# Patient Record
Sex: Male | Born: 1970 | Race: White | Hispanic: Yes | Smoking: Never smoker
Health system: Southern US, Community
[De-identification: ages and names within clinical notes are randomized; demographics above are authoritative.]

## PROBLEM LIST (undated history)

## (undated) DIAGNOSIS — R569 Unspecified convulsions: Secondary | ICD-10-CM

## (undated) HISTORY — DX: Unspecified convulsions: R56.9

## (undated) HISTORY — PX: OTHER SURGICAL HISTORY: SHX169

---

## 2001-10-08 ENCOUNTER — Emergency Department (HOSPITAL_COMMUNITY): Admission: EM | Admit: 2001-10-08 | Discharge: 2001-10-09 | Payer: Self-pay | Admitting: *Deleted

## 2002-08-09 ENCOUNTER — Encounter: Payer: Self-pay | Admitting: Emergency Medicine

## 2002-08-09 ENCOUNTER — Emergency Department (HOSPITAL_COMMUNITY): Admission: EM | Admit: 2002-08-09 | Discharge: 2002-08-09 | Payer: Self-pay | Admitting: Emergency Medicine

## 2003-12-05 IMAGING — CT CT HEAD W/O CM
1 series · 16 of 28 positions shown, 20 images · non-contrast
Comparison: none

FINDINGS
CLINICAL DATA: HEADACHE FOR THE PAST THREE DAYS, HISTORY OF SEIZURES.
CT HEAD WITHOUT CONTRAST MEDIA
AXIAL IMAGES OF THE HEAD, OBTAINED WITHOUT INTRAVENOUS CONTRAST, ARE CORRELATED WITH THE PREVIOUS
HEAD CT DATED 09/29/98 AND PREVIOUS BRAIN MRI EXAMINATION DATED 10/09/98.  AGAIN DEMONSTRATED ARE
MULTIPLE CYSTIC AND CALCIFIED LESIONS IN BOTH CEREBRAL HEMISPHERES.  THERE HAS BEEN INTERVAL
ENLARGEMENT OF CYSTIC LESIONS IN BOTH PARIETAL LOBES.  THESE INCLUDE A 3.2 CM CYSTIC AREA WITH
SURROUNDING WHITE MATTER EDEMA IN THE LEFT PARIETAL LOBE AND 2.9 CM AND 2.7 CM SIMILAR AREAS IN THE
RIGHT PARIETAL LOBE.  THE WHITE MATTER EDEMA WAS NOT PREVIOUSLY PRESENT.  THE VENTRICLES REMAIN
NORMAL IN SIZE AND POSITION.  NO INTRACRANIAL HEMORRHAGE IS SEEN.  UNREMARKABLE BONES AND PARANASAL
SINUSES.
IMPRESSION
PROGRESSIVE NEUROCYSTISERCOSIS WITH AREAS OF ACTIVE INFECTION IN BOTH PARIETAL LOBES AS DESCRIBED
ABOVE.

[Series 7035: — · axial · 0.39mm/px · z∈[-615,-490]mm · 16 of 28 slices shown, 20 images]
[im 2/28  brain]
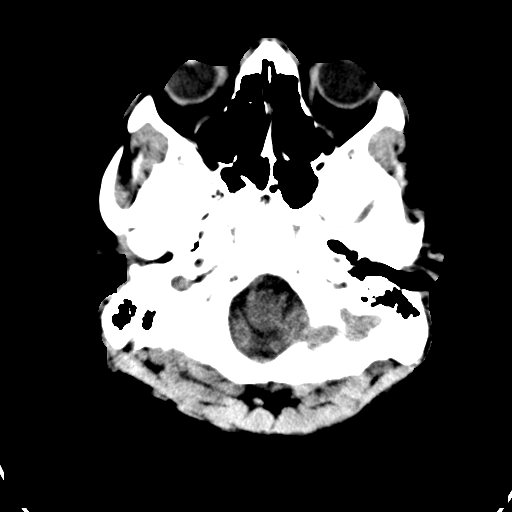
[im 2/28  bone]
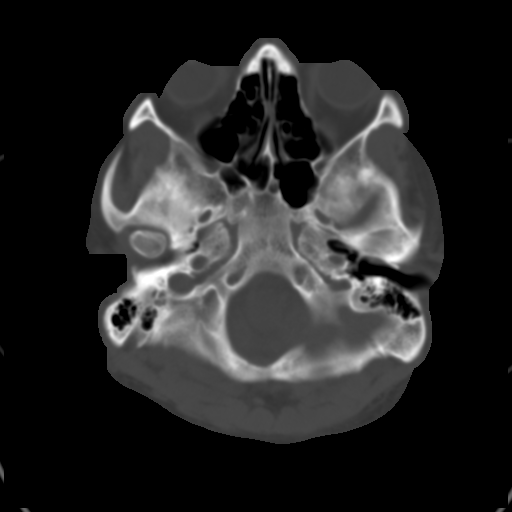
[im 4/28  brain]
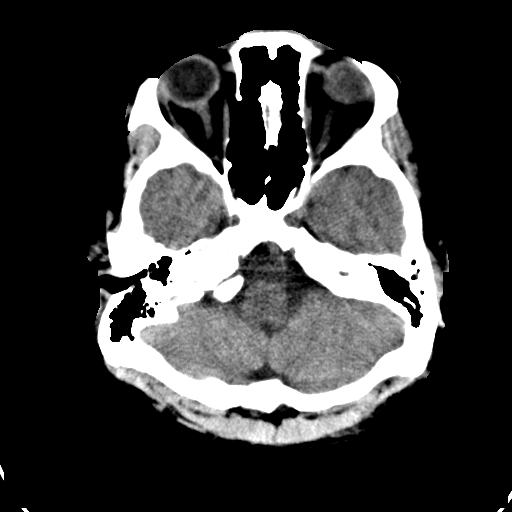
[im 6/28  brain]
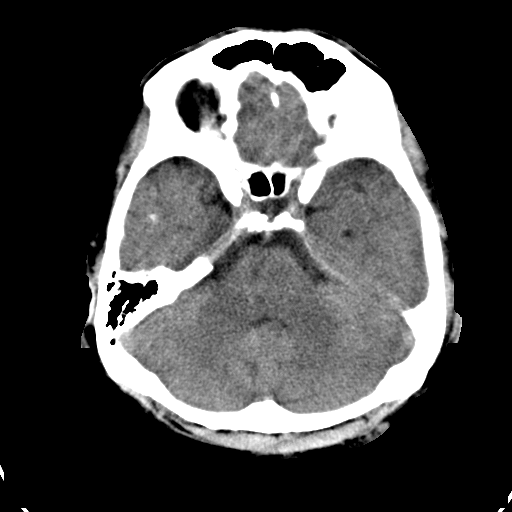
[im 7/28  brain]
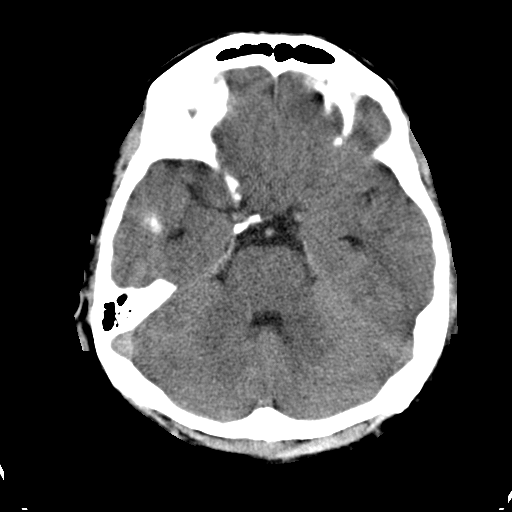
[im 9/28  brain]
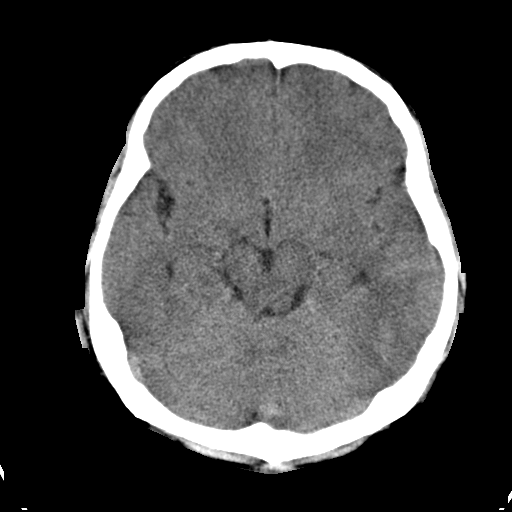
[im 9/28  bone]
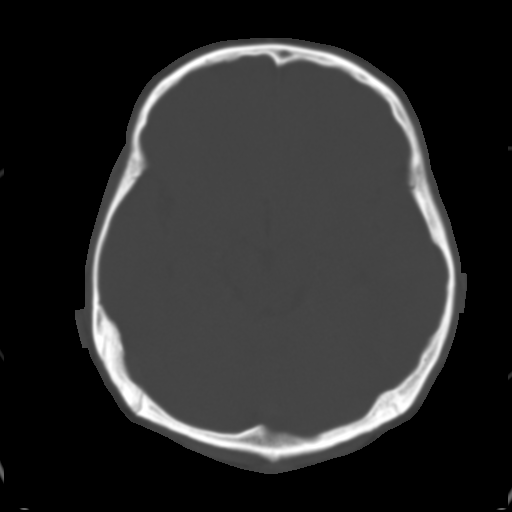
[im 10/28  brain]
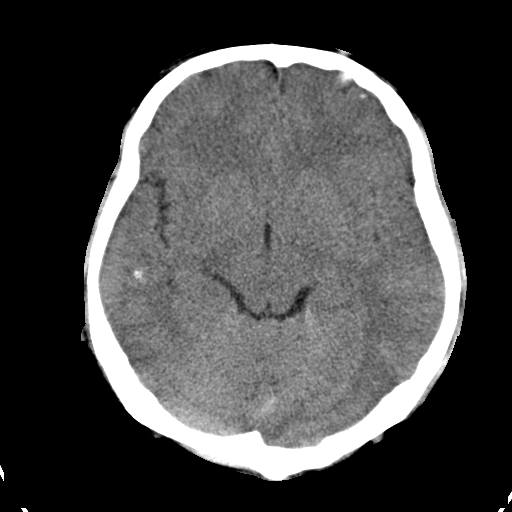
[im 12/28  brain]
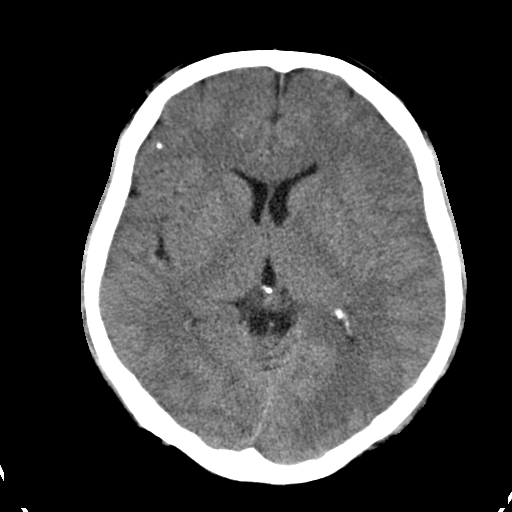
[im 14/28  brain]
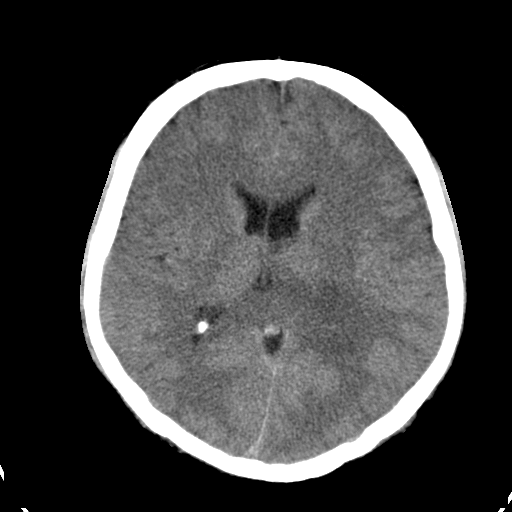
[im 15/28  brain]
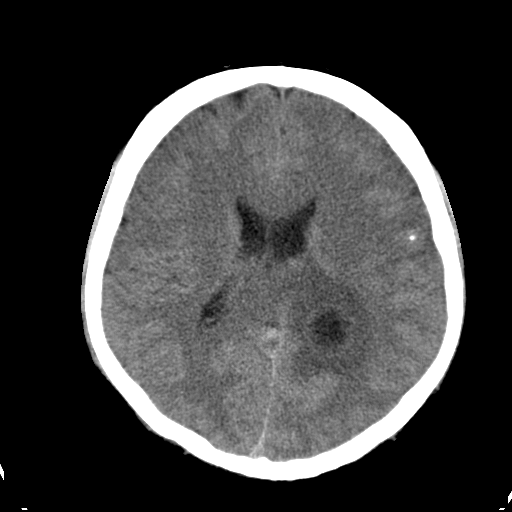
[im 15/28  bone]
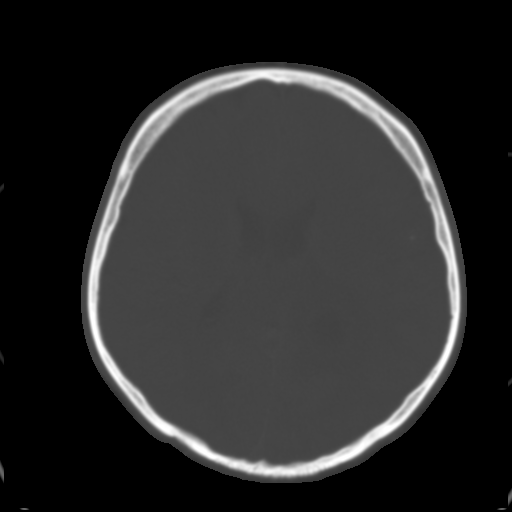
[im 17/28  brain]
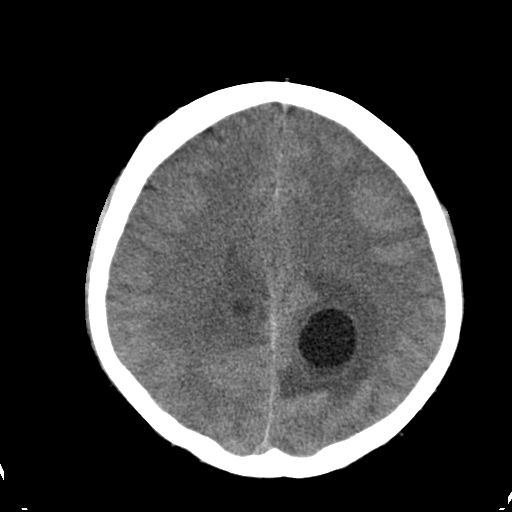
[im 19/28  brain]
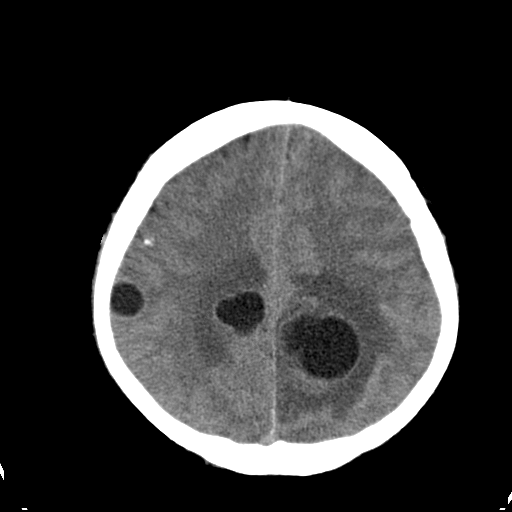
[im 20/28  brain]
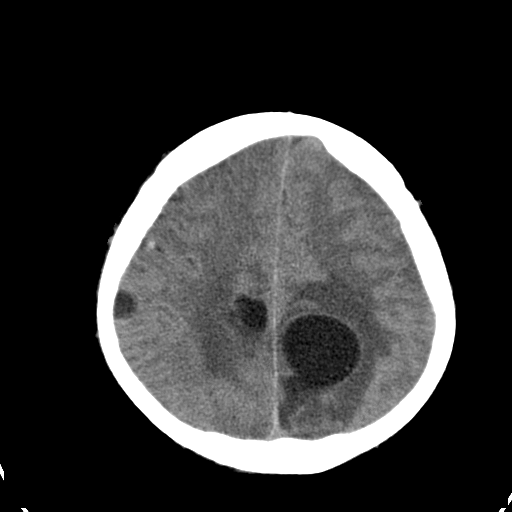
[im 22/28  brain]
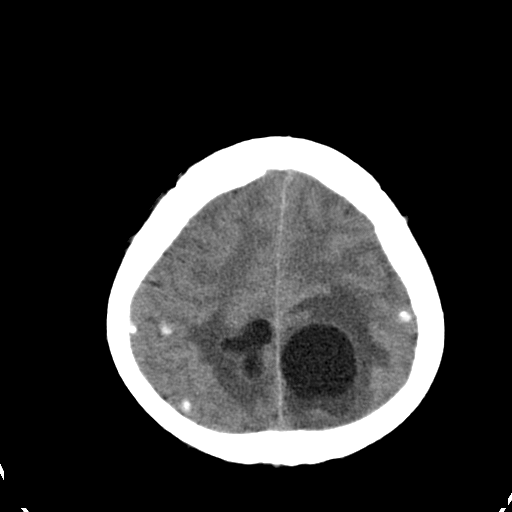
[im 22/28  bone]
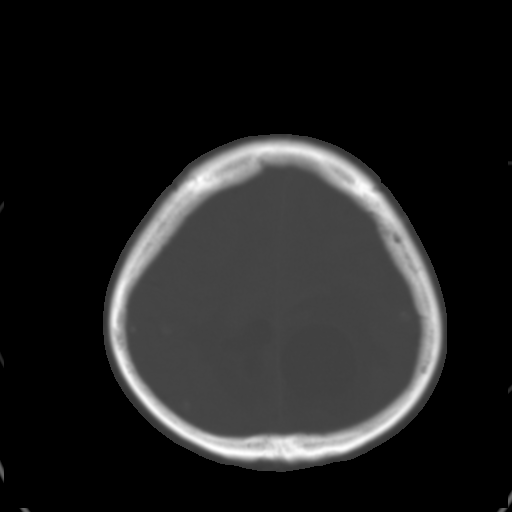
[im 23/28  brain]
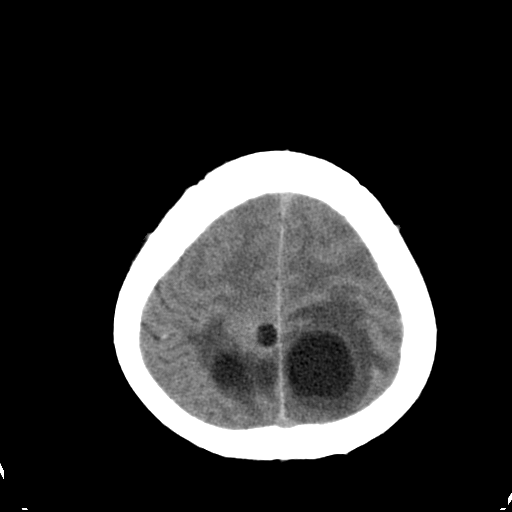
[im 25/28  brain]
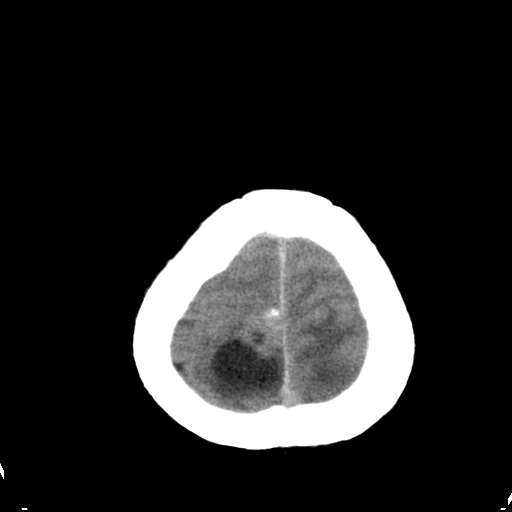
[im 27/28  brain]
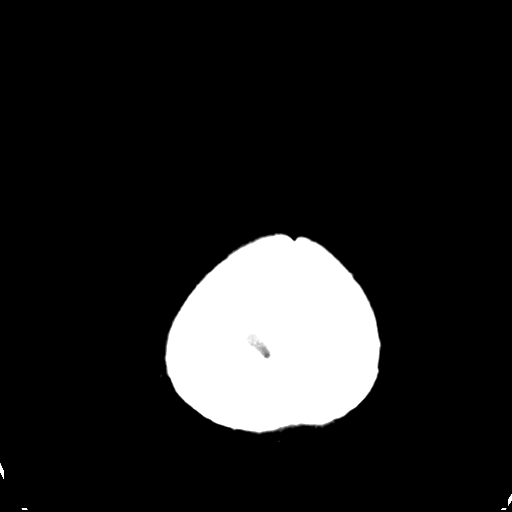

[16 of 28 positions shown; findings below may reference images not displayed]

## 2004-04-22 ENCOUNTER — Emergency Department (HOSPITAL_COMMUNITY): Admission: EM | Admit: 2004-04-22 | Discharge: 2004-04-22 | Payer: Self-pay | Admitting: Emergency Medicine

## 2004-07-10 ENCOUNTER — Emergency Department (HOSPITAL_COMMUNITY): Admission: EM | Admit: 2004-07-10 | Discharge: 2004-07-10 | Payer: Self-pay | Admitting: Emergency Medicine

## 2004-11-05 ENCOUNTER — Emergency Department (HOSPITAL_COMMUNITY): Admission: EM | Admit: 2004-11-05 | Discharge: 2004-11-05 | Payer: Self-pay | Admitting: *Deleted

## 2005-07-13 ENCOUNTER — Emergency Department (HOSPITAL_COMMUNITY): Admission: EM | Admit: 2005-07-13 | Discharge: 2005-07-14 | Payer: Self-pay | Admitting: Emergency Medicine

## 2006-03-03 IMAGING — CT CT HEAD W/O CM
1 series · 16 of 30 positions shown, 20 images · IV contrast (agent unspecified)
Comparison: 07/10/2004.

CLINICAL DATA: Seizures.  Known history of neurosarcoidosis.  
HEAD CT WITHOUT CONTRAST:

[Series 7836: — · axial · 0.40mm/px · z∈[-671,-536]mm · 16 of 30 slices shown, 20 images]
[im 2/30  brain]
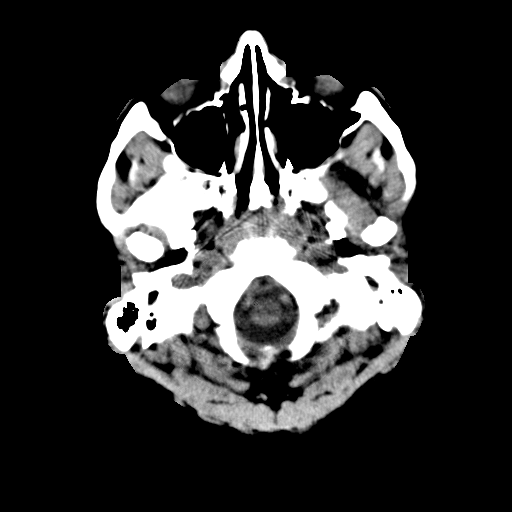
[im 2/30  bone]
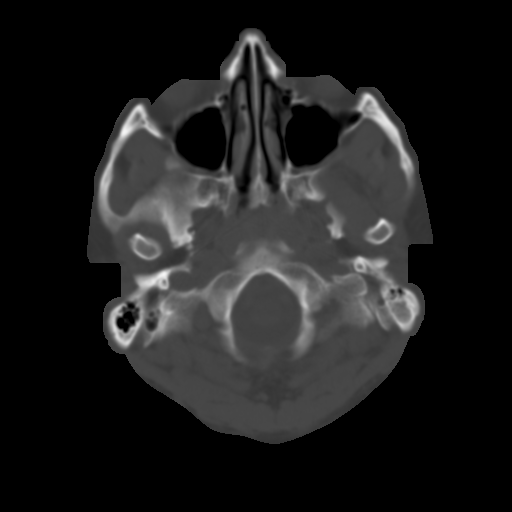
[im 4/30  brain]
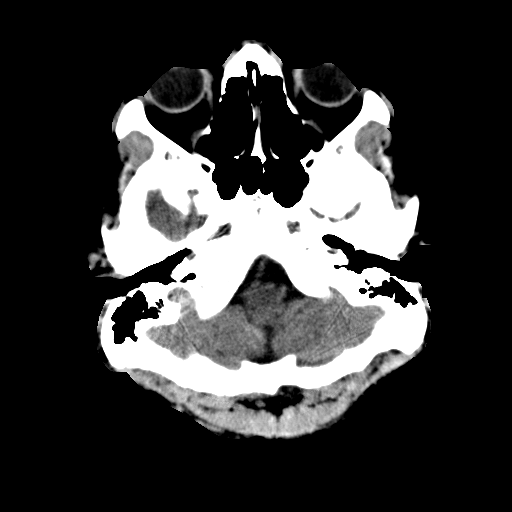
[im 6/30  brain]
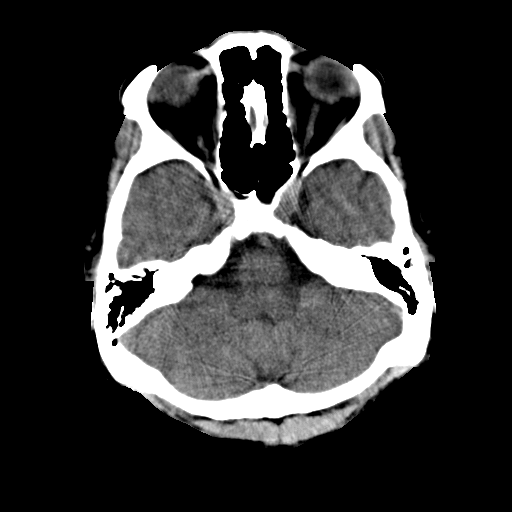
[im 8/30  brain]
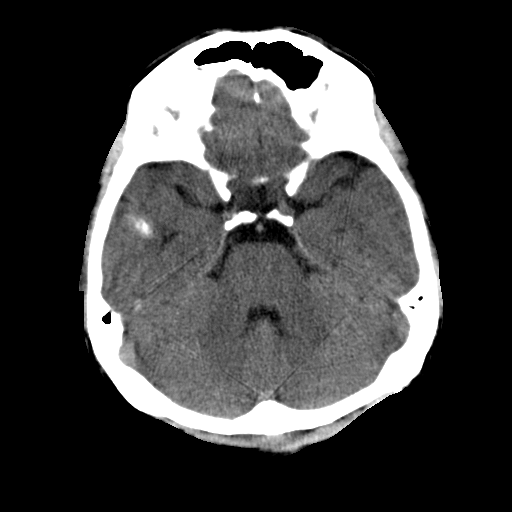
[im 9/30  brain]
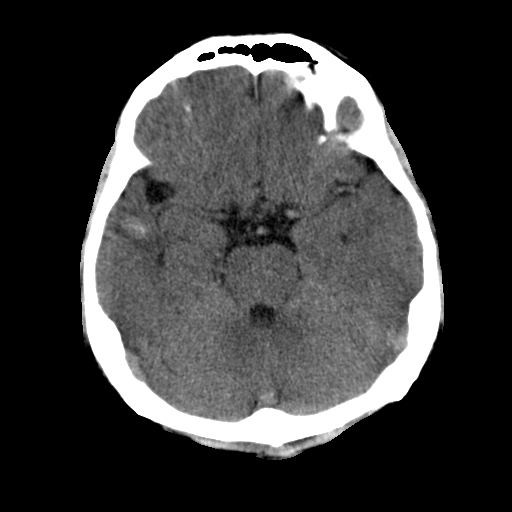
[im 9/30  bone]
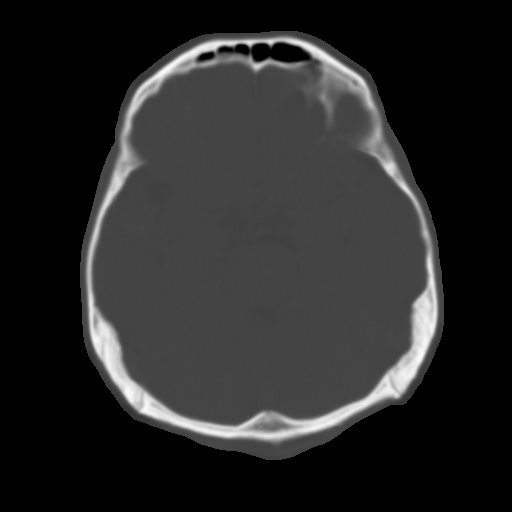
[im 11/30  brain]
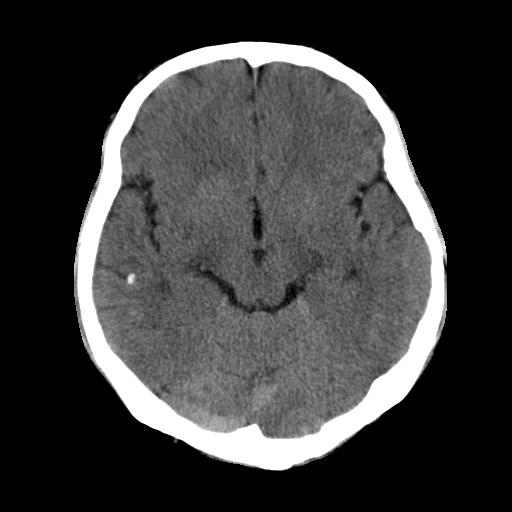
[im 13/30  brain]
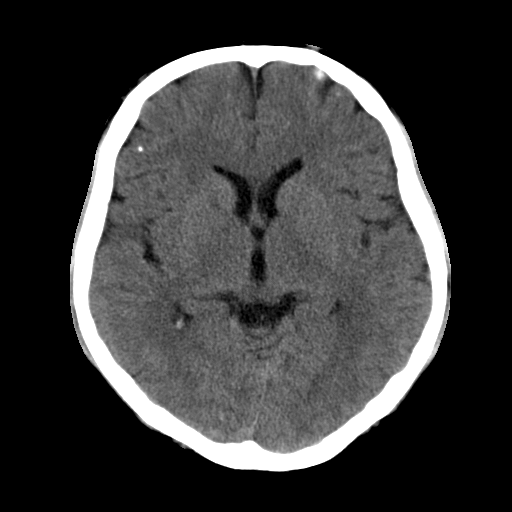
[im 15/30  brain]
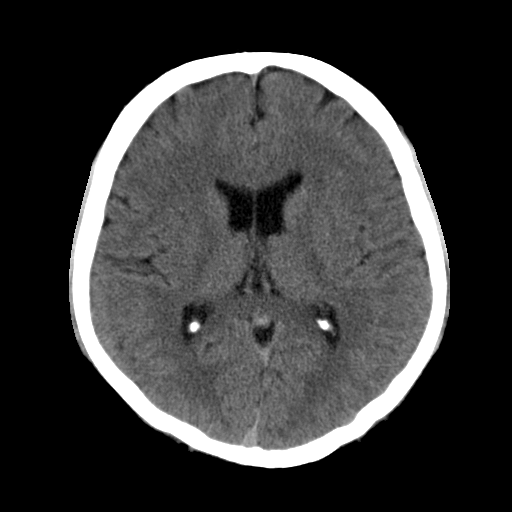
[im 16/30  brain]
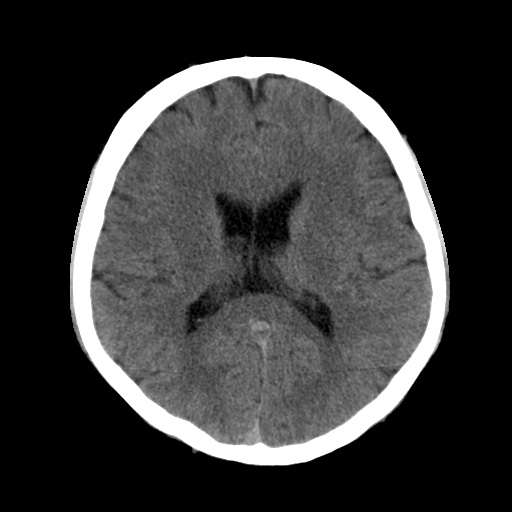
[im 16/30  bone]
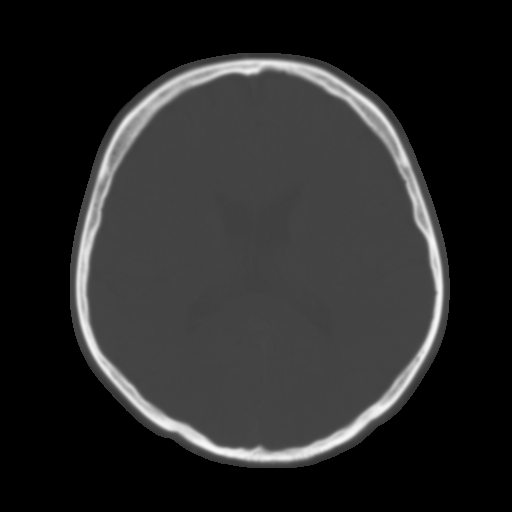
[im 18/30  brain]
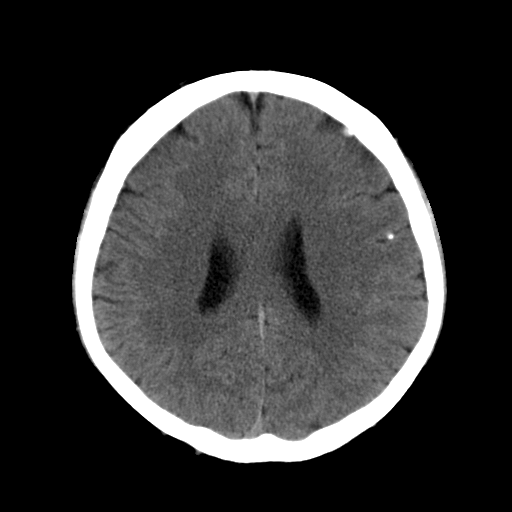
[im 20/30  brain]
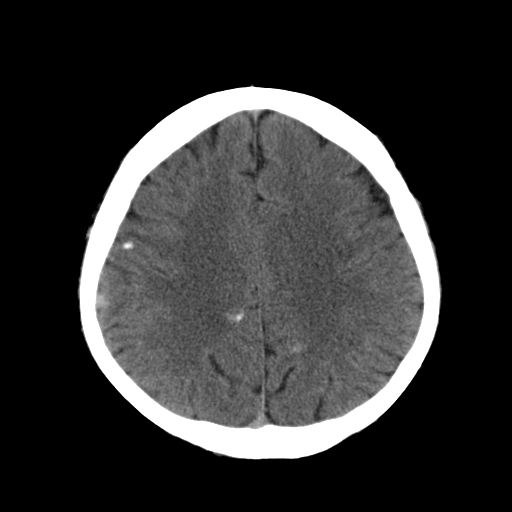
[im 22/30  brain]
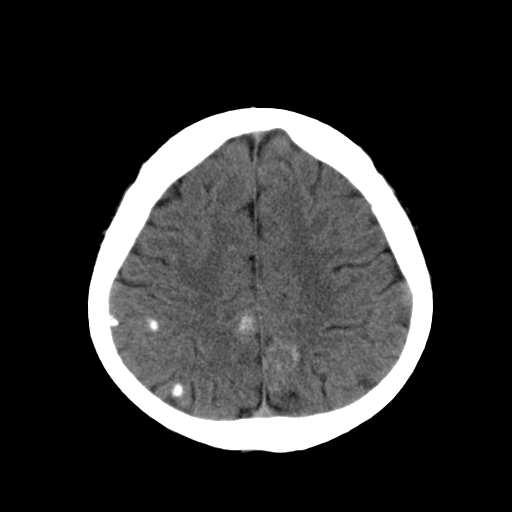
[im 23/30  brain]
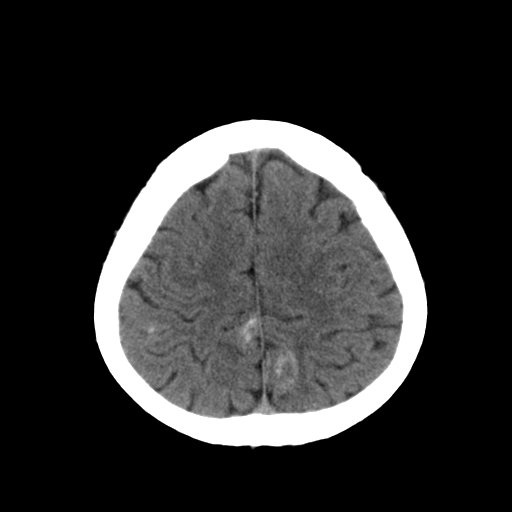
[im 23/30  bone]
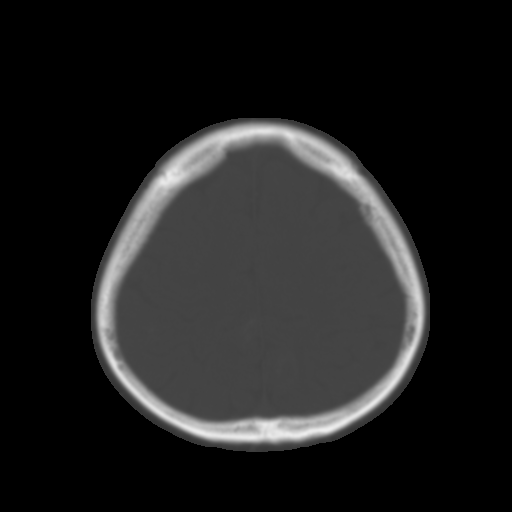
[im 25/30  brain]
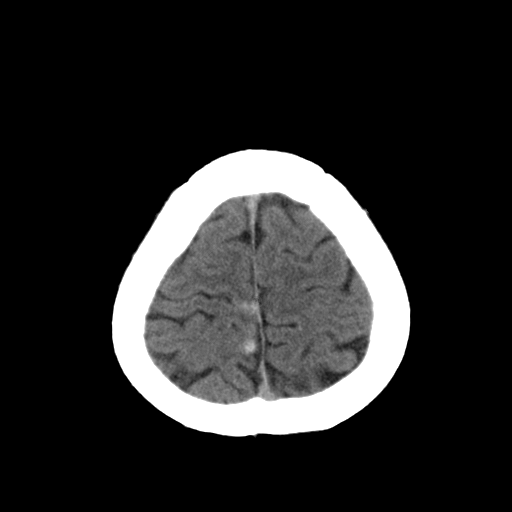
[im 27/30  brain]
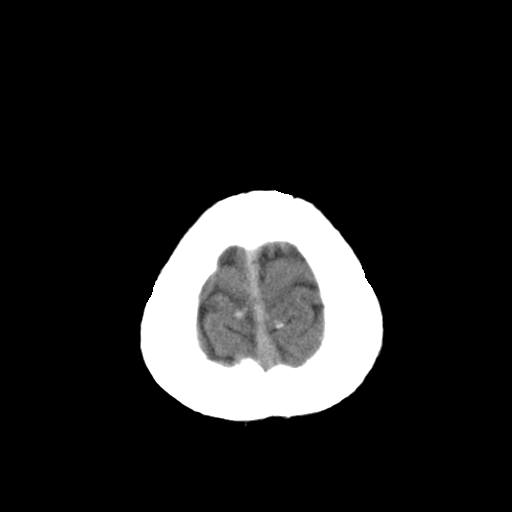
[im 29/30  brain]
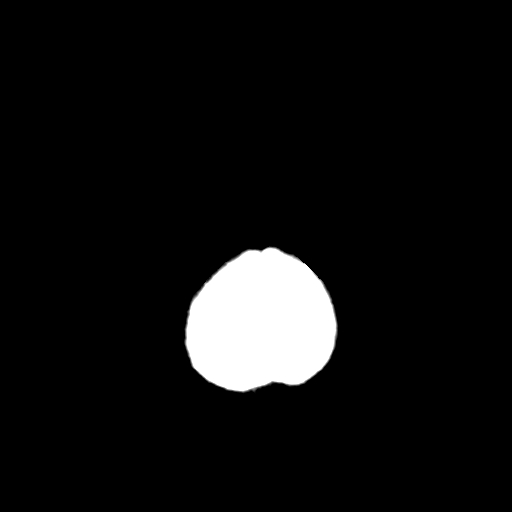

[16 of 30 positions shown; findings below may reference images not displayed]

There has been no significant change since prior examination with multiple calcified lesions scattered throughout the brain parenchyma consistent with patient?s history of neurosarcoidosis.  If further delineation is clinically desired, MR imaging may be considered.  No evidence of hydrocephalus, midline shift, or intracranial hemorrhage.
IMPRESSION: Similar appearance to the 07/10/2004 exam with evidence of sequelae of neurosarcoidosis with multiple calcified lesions throughout the brain parenchyma.

## 2010-11-01 ENCOUNTER — Encounter: Payer: Self-pay | Admitting: Neurology

## 2012-11-03 ENCOUNTER — Other Ambulatory Visit: Payer: Self-pay | Admitting: Neurology

## 2012-11-03 DIAGNOSIS — G40109 Localization-related (focal) (partial) symptomatic epilepsy and epileptic syndromes with simple partial seizures, not intractable, without status epilepticus: Secondary | ICD-10-CM

## 2012-11-03 DIAGNOSIS — M839 Adult osteomalacia, unspecified: Secondary | ICD-10-CM

## 2012-11-03 DIAGNOSIS — B699 Cysticercosis, unspecified: Secondary | ICD-10-CM

## 2013-02-06 ENCOUNTER — Other Ambulatory Visit: Payer: Self-pay

## 2013-02-06 MED ORDER — PHENYTOIN SODIUM EXTENDED 100 MG PO CAPS: 200.0000 mg | ORAL_CAPSULE | Freq: Two times a day (BID) | ORAL | Status: DC

## 2013-02-06 NOTE — Telephone Encounter (Signed)
Former [facility] patient, has not been assigned new provider.  Auth refill via [facility] 

## 2013-10-25 ENCOUNTER — Ambulatory Visit (INDEPENDENT_AMBULATORY_CARE_PROVIDER_SITE_OTHER): Payer: BC Managed Care – HMO | Admitting: Orthopedic Surgery

## 2013-10-25 DIAGNOSIS — M65319 Trigger thumb, unspecified thumb: Secondary | ICD-10-CM

## 2013-10-25 DIAGNOSIS — M6530 Trigger finger, unspecified finger: Secondary | ICD-10-CM

## 2013-10-25 NOTE — Progress Notes (Signed)
Patient ID: Cathlean SauerBayron F Glynn, male   DOB: 19-Aug-1971, 43 y.o.   MRN: [id number] Chief complaint pain clicking locking left thumb for 402 months  43 year old male received one cortisone injection for trigger thumb presents with consistent and persistent triggering of the left thumb and pain over the A1 pulley with locking and catching  His review of systems is negative he's a healthy male he did have a history of seizures takes Dilantin has a family history of cancer currently divorced. He reports smoking history is negative alcohol history is positive    There were no vitals taken for this visit. General appearance is normal, the patient is alert and oriented x3 with normal mood and affect. Ambulatory status is normal. He has tenderness over the A1 pulley catching and locking but normal range of motion in flexion power is normal at the PIP joint skin is intact pulses are intact sensation is normal in the digit there is no lymphadenopathy  Has a trigger thumb  We injected  Followup as needed copy to Dr. Malvin JohnsBradford  Procedure note trigger thumb injection  Diagnosis trigger finger Postop diagnosis trigger finger Procedure injection of trigger finger Finger injected left thumb Details of procedure: After verbal consent and timeout to confirm site the RIGHT thumb was injected with 1 cc of 40 mg of Depo-Medrol and 1 cc of 1% lidocaine  The procedure was tolerated well without complication

## 2013-10-25 NOTE — Patient Instructions (Signed)

## 2014-03-11 ENCOUNTER — Other Ambulatory Visit: Payer: Self-pay | Admitting: Diagnostic Neuroimaging

## 2014-03-13 NOTE — Telephone Encounter (Signed)
This is a former [facility] patient.  I have attempted to contact him numerous times to no avail.

## 2014-03-15 ENCOUNTER — Telehealth: Payer: Self-pay | Admitting: *Deleted

## 2014-03-15 MED ORDER — PHENYTOIN SODIUM EXTENDED 100 MG PO CAPS: 200.0000 mg | ORAL_CAPSULE | Freq: Two times a day (BID) | ORAL | Status: DC

## 2014-03-15 NOTE — Telephone Encounter (Signed)
Pt can be scheduled with Dr. Terrace Arabia.

## 2014-03-29 ENCOUNTER — Encounter: Payer: Self-pay | Admitting: Neurology

## 2014-03-29 ENCOUNTER — Ambulatory Visit (INDEPENDENT_AMBULATORY_CARE_PROVIDER_SITE_OTHER): Payer: BC Managed Care – HMO | Admitting: Neurology

## 2014-03-29 VITALS — BP 113/73 | HR 62 | Ht 65.0 in | Wt 146.0 lb

## 2014-03-29 DIAGNOSIS — R569 Unspecified convulsions: Secondary | ICD-10-CM | POA: Insufficient documentation

## 2014-03-29 MED ORDER — LEVETIRACETAM 500 MG PO TABS: 500.0000 mg | ORAL_TABLET | Freq: Two times a day (BID) | ORAL | Status: DC

## 2014-03-29 NOTE — Progress Notes (Signed)
PATIENT: Gary Vang DOB: June 20, 1971  HISTORICAL  Gary Vang is a 43 years old Hispanic male, accompanied by his significant other's, also serve as interpreter, for evaluation of complex partial seizure, he was a patient of Dr. love, last clinical visit was in January 2014  [place] Hispanic speaking, had a recurrent seizure since 1999, previous MRI of the brain demonstrate multiple fluid filled cyst in the brain, secondary to neurocysticercosis, he never had a brain biopsy, but has been treated with 2 courses of albendazole, and a prednisone, he has simple partial, complex partial, secondary generalized seizure, has been treated with Dilantin 100 mg 2 tablets twice a day, previously was taking carbamazepine, was stopped in 2009, per record, he was continued on having seizure in 2013, but exact frequency was unknown,  Today he reported he has not had seizure for many years,  He is worried about the long-term side effect of Dilantin, wants to change to new antiepileptic medications after discussion  REVIEW OF SYSTEMS: Full 14 system review of systems performed and notable only for neck pain, ringing ears, eye itching, redness, insomnia, excessive thirst, back pain, headaches, seizure, facial droop  ALLERGIES: No Known Allergies  HOME MEDICATIONS: Current Outpatient Prescriptions on File Prior to Visit  Medication Sig Dispense Refill  . phenytoin (DILANTIN) 100 MG ER capsule Take 2 capsules (200 mg total) by mouth 2 (two) times daily.  120 capsule  0   No current facility-administered medications on file prior to visit.    PAST MEDICAL HISTORY: Past Medical History  Diagnosis Date  . Seizure     PAST SURGICAL HISTORY: Past Surgical History  Procedure Laterality Date  . None      FAMILY HISTORY: No family history on file.  SOCIAL HISTORY:  History   Social History  . Marital Status: Divorced    Spouse Name: N/A    Number of Children: 2    . Years of Education: 12   Occupational History  .     Social History Main Topics  . Smoking status: Never Smoker   . Smokeless tobacco: Never Used  . Alcohol Use: 2.4 - 4.8 oz/week    4-8 Cans of beer per week  . Drug Use: No  . Sexual Activity: Not on file   Other Topics Concern  . Not on file   Social History Narrative   Patient lives at home alone and he is single.   Patient  Works full time    Right handed.   Caffeine none     PHYSICAL EXAM   Filed Vitals:   03/29/14 1325  BP: 113/73  Pulse: 62  Height: 5\' 5"  (1.651 m)  Weight: 146 lb (66.225 kg)    Not recorded    Body mass index is 24.3 kg/(m^2).   Generalized: In no acute distress  Neck: Supple, no carotid bruits   Cardiac: Regular rate rhythm  Pulmonary: Clear to auscultation bilaterally  Musculoskeletal: No deformity  Neurological examination  Mentation: Alert oriented to time, place, history taking, and causual conversation  Cranial nerve II-XII: Pupils were equal round reactive to light. Extraocular movements were full.  Visual field were full on confrontational test. Bilateral fundi were sharp.  Facial sensation and strength were normal. Hearing was intact to finger rubbing bilaterally. Uvula tongue midline.  Head turning and shoulder shrug and were normal and symmetric.Tongue protrusion into cheek strength was normal.  Motor: Normal tone, bulk and strength.  Sensory: Intact to  fine touch, pinprick, preserved vibratory sensation, and proprioception at toes.  Coordination: Normal finger to nose, heel-to-shin bilaterally there was no truncal ataxia  Gait: Rising up from seated position without assistance, normal stance, without trunk ataxia, moderate stride, good arm swing, smooth turning, able to perform tiptoe, and heel walking without difficulty.   Romberg signs: Negative  Deep tendon reflexes: Brachioradialis 2/2, biceps 2/2, triceps 2/2, patellar 2/2, Achilles 2/2, plantar responses  were flexor bilaterally.   DIAGNOSTIC DATA (LABS, IMAGING, TESTING) - I reviewed patient records, labs, notes, testing and imaging myself where available.   ASSESSMENT AND PLAN  Gary Vang is a 43 y.o. Hispanic male, with history of partial seizure, due to neurocysticercosis,  worry about long-term side effect of Dilantin, will change to Keppra 500 twice a day,, tapering off Dilantin, return to clinic in one year   Levert FeinsteinYijun Yan, M.D. Ph.D.  [facility] [address], [address] [place], [place] [address] [phone]

## 2014-03-29 NOTE — Patient Instructions (Signed)
Dilantin 100mg  2 twice a day  Now ii/ii 1st week, i/ii 2nd week 0/ii 3rd week, 0/i 4th week 0/0

## 2014-05-21 ENCOUNTER — Encounter: Payer: Self-pay | Admitting: Nurse Practitioner

## 2014-05-28 ENCOUNTER — Ambulatory Visit (INDEPENDENT_AMBULATORY_CARE_PROVIDER_SITE_OTHER): Payer: BC Managed Care – HMO | Admitting: Orthopedic Surgery

## 2014-05-28 ENCOUNTER — Encounter: Payer: Self-pay | Admitting: Orthopedic Surgery

## 2014-05-28 VITALS — BP 119/70 | Ht 65.0 in | Wt 146.0 lb

## 2014-05-28 DIAGNOSIS — M6530 Trigger finger, unspecified finger: Secondary | ICD-10-CM

## 2014-05-28 DIAGNOSIS — M65312 Trigger thumb, left thumb: Secondary | ICD-10-CM

## 2014-05-28 NOTE — Progress Notes (Signed)
Pain left thumb  History of triggering received injection did well comes back with clicking popping locking and triggering over the A1 pulley of the left thumb  Review of systems no numbness or tingling  Mood is normal oriented x3 appearance is good tenderness over the A1 pulley normal range of motion with clicking and popping no instability normal flexion power at the FPL tendon  Scans intact  Impression trigger left thumb  Recommend injection  Followup as needed  Procedure note trigger thumb injection  Diagnosis trigger finger/thumb Postop diagnosis trigger /thumb Procedure injection of trigger finger Finger injected left thumb   Details of procedure: After verbal consent and timeout to confirm site the left  thumb was injected with 1 cc of 40 mg of Depo-Medrol and 1 cc of 1% lidocaine  The procedure was tolerated well without complication

## 2014-09-03 ENCOUNTER — Encounter: Payer: Self-pay | Admitting: Nurse Practitioner

## 2014-10-01 ENCOUNTER — Ambulatory Visit (INDEPENDENT_AMBULATORY_CARE_PROVIDER_SITE_OTHER): Payer: BC Managed Care – HMO | Admitting: Orthopedic Surgery

## 2014-10-01 ENCOUNTER — Encounter: Payer: Self-pay | Admitting: Orthopedic Surgery

## 2014-10-01 VITALS — BP 112/74 | Ht 65.0 in | Wt 146.0 lb

## 2014-10-01 DIAGNOSIS — M65312 Trigger thumb, left thumb: Secondary | ICD-10-CM

## 2014-10-01 NOTE — Progress Notes (Signed)
Patient ID: Gary Vang, male   DOB: 1971-01-12, 43 y.o.   MRN: [id number]   Chief Complaint  Patient presents with  . Follow-up    Left trigger thumb, requesting injection.   Triggering started again about 2 weeks ago he was last injected in August  Left Trigger thumb injection Medication  1 mL of 40 mg Depo-Medrol  2 mL of 1% lidocaine plain  Ethyl chloride for anesthesia  Verbal consent was obtained timeout was taken to confirm the injection site as left thumb  Alcohol was used to prepare the skin along with ethyl chloride and then the injection was made at the A1 pulley there were no complications

## 2014-10-01 NOTE — Patient Instructions (Signed)

## 2015-03-28 ENCOUNTER — Ambulatory Visit: Payer: BC Managed Care – HMO | Admitting: Nurse Practitioner

## 2015-04-10 ENCOUNTER — Other Ambulatory Visit: Payer: Self-pay | Admitting: Neurology

## 2015-05-26 ENCOUNTER — Telehealth: Payer: Self-pay | Admitting: *Deleted

## 2015-05-26 MED ORDER — LEVETIRACETAM 500 MG PO TABS: 500.0000 mg | ORAL_TABLET | Freq: Two times a day (BID) | ORAL | Status: DC

## 2015-05-26 NOTE — Telephone Encounter (Signed)
Rx has been sent.  Patient has appt scheduled.   

## 2015-05-27 ENCOUNTER — Other Ambulatory Visit: Payer: Self-pay | Admitting: Neurology

## 2015-06-10 ENCOUNTER — Ambulatory Visit (INDEPENDENT_AMBULATORY_CARE_PROVIDER_SITE_OTHER): Payer: BLUE CROSS/BLUE SHIELD | Admitting: Nurse Practitioner

## 2015-06-10 ENCOUNTER — Encounter: Payer: Self-pay | Admitting: Nurse Practitioner

## 2015-06-10 VITALS — BP 117/82 | HR 54 | Ht 64.0 in | Wt 155.0 lb

## 2015-06-10 DIAGNOSIS — R569 Unspecified convulsions: Secondary | ICD-10-CM

## 2015-06-10 MED ORDER — LEVETIRACETAM 500 MG PO TABS: 500.0000 mg | ORAL_TABLET | Freq: Two times a day (BID) | ORAL | Status: AC

## 2015-06-10 NOTE — Progress Notes (Signed)
I have read the note, and I agree with the clinical assessment and plan.  Meagon Duskin KEITH   

## 2015-06-10 NOTE — Patient Instructions (Signed)
Continue Keppra at current dose will refill 3 months with 3 refills Call for seizure activity F/U yearly

## 2015-06-10 NOTE — Progress Notes (Signed)
I have read the note, and I agree with the clinical assessment and plan.  Zorian Gunderman KEITH   

## 2015-06-10 NOTE — Progress Notes (Signed)
[facility]  PATIENT: Gary Vang DOB: 12/12/1970   REASON FOR VISIT: Follow-up for seizure disorder complex partial HISTORY FROM: Patient via interpreter    HISTORY OF PRESENT ILLNESS:Gary Vang is a 44 years old Hispanic male, accompanied by his significant other's, also serve as interpreter, for evaluation of complex partial seizure, he was a patient of Dr. love, last clinical visit was in January 2014  He came from [place], Hispanic speaking, had a recurrent seizure since 1999, previous MRI of the brain demonstrate multiple fluid filled cyst in the brain, secondary to neurocysticercosis, he never had a brain biopsy, but has been treated with 2 courses of albendazole, and a prednisone, he has simple partial, complex partial, secondary generalized seizure, has been treated with Dilantin 100 mg 2 tablets twice a day, previously was taking carbamazepine, was stopped in 2009, per record, he was continued on having seizure in 2013, but exact frequency was unknown, Today he reported he has not had seizure for many years, He is worried about the long-term side effect of Dilantin, wants to change to new antiepileptic medications after discussion  Update 06/10/2015 [name], 44 year old male returns for follow-up. He has a history of complex partial seizure disorder with last seizure occurring in 2013. He was successfully switched from Dilantin to Keppra after his last visit with Dr. Terrace Arabia 03/29/2014. He denies missing any doses, he denies any side effects. He needs refills    REVIEW OF SYSTEMS: Full 14 system review of systems performed and notable only for those listed, all others are neg:  Constitutional: neg  Cardiovascular: neg Ear/Nose/Throat: neg  Skin: neg Eyes: neg Respiratory: neg Gastroitestinal: neg  Hematology/Lymphatic: neg  Endocrine: neg Musculoskeletal: Joint pain Allergy/Immunology: neg Neurological: History of seizure  disorder Psychiatric: neg Sleep : neg   ALLERGIES: No Known Allergies  HOME MEDICATIONS: Outpatient Prescriptions Prior to Visit  Medication Sig Dispense Refill  . levETIRAcetam (KEPPRA) 500 MG tablet Take 1 tablet (500 mg total) by mouth 2 (two) times daily. (Patient taking differently: Take 500 mg by mouth 3 (three) times daily. ) 60 tablet 0  . levETIRAcetam (KEPPRA) 500 MG tablet Take 1 tablet (500 mg total) by mouth 2 (two) times daily. (Patient not taking: Reported on 06/10/2015) 60 tablet 0  . phenytoin (DILANTIN) 100 MG ER capsule Take 2 capsules (200 mg total) by mouth 2 (two) times daily. (Patient not taking: Reported on 06/10/2015) 120 capsule 0   No facility-administered medications prior to visit.    PAST MEDICAL HISTORY: Past Medical History  Diagnosis Date  . Seizure     PAST SURGICAL HISTORY: Past Surgical History  Procedure Laterality Date  . None      FAMILY HISTORY: History reviewed. No pertinent family history.  SOCIAL HISTORY: Social History   Social History  . Marital Status: Divorced    Spouse Name: N/A  . Number of Children: 2  . Years of Education: 12   Occupational History  .     Social History Main Topics  . Smoking status: Never Smoker   . Smokeless tobacco: Never Used  . Alcohol Use: 2.4 - 4.8 oz/week    4-8 Cans of beer per week  . Drug Use: No  . Sexual Activity: Not on file   Other Topics Concern  . Not on file   Social History Narrative   Patient lives at home alone and he is single.   Patient  Works full time    Right handed.  Caffeine none     PHYSICAL EXAM  Filed Vitals:   06/10/15 1526  BP: 117/82  Pulse: 54  Height:  (1.626 m)  Weight: 155 lb (70.308 kg)   Body mass index is 26.59 kg/(m^2). Generalized: In no acute distress Neck: Supple, no carotid bruits  Cardiac: Regular rate rhythm Musculoskeletal: No deformity  Neurological examination Mentation: Alert oriented to time, place, history  taking, and causual conversation Cranial nerve II-XII: Pupils were equal round reactive to light. Extraocular movements were full. Visual field were full on confrontational test. Bilateral fundi were sharp. Facial sensation and strength were normal. Hearing was intact to finger rubbing bilaterally. Uvula tongue midline. Head turning and shoulder shrug and were normal and symmetric.Tongue protrusion into cheek strength was normal. Motor: Normal tone, bulk and strength. Sensory: Intact to fine touch, pinprick, preserved vibratory sensation, and proprioception at toes. Coordination: Normal finger to nose, heel-to-shin bilaterally  Gait: Rising up from seated position without assistance, normal stance,  moderate stride, good arm swing, smooth turning, able to perform tiptoe, and heel walking without difficulty. Tandem gait is steady Deep tendon reflexes: Brachioradialis 2/2, biceps 2/2, triceps 2/2, patellar 2/2, Achilles 2/2, plantar responses were flexor bilaterally.    DIAGNOSTIC DATA (LABS, IMAGING, TESTING) -  ASSESSMENT AND PLAN  44 y.o. year old male  has a past medical history of complex partial seizure. here to follow-up.The patient is a current patient of Dr. Terrace Arabia  who is out of the office today . This note is sent to the work in doctor.     Continue Keppra at current dose will refill 3 months with 3 refills Call for seizure activity F/U yearly [name], [facility], [facility], APRN  [facility] [address] [place], [place] [address] [phone]

## 2016-06-09 ENCOUNTER — Ambulatory Visit: Payer: BLUE CROSS/BLUE SHIELD | Admitting: Nurse Practitioner

## 2016-06-10 ENCOUNTER — Encounter: Payer: Self-pay | Admitting: Nurse Practitioner

## 2016-07-24 ENCOUNTER — Other Ambulatory Visit: Payer: Self-pay | Admitting: Nurse Practitioner
